# Patient Record
Sex: Male | Born: 1990 | Race: White | Hispanic: No | Marital: Married | Smoking: Never smoker
Health system: Southern US, Community
[De-identification: ages and names within clinical notes are randomized; demographics above are authoritative.]

## PROBLEM LIST (undated history)

## (undated) HISTORY — PX: KNEE SURGERY: SHX244

---

## 2015-08-09 ENCOUNTER — Ambulatory Visit: Payer: Self-pay | Admitting: Physician Assistant

## 2015-08-09 DIAGNOSIS — Z299 Encounter for prophylactic measures, unspecified: Secondary | ICD-10-CM

## 2015-08-09 MED ORDER — TETANUS-DIPHTH-ACELL PERTUSSIS 5-2.5-18.5 LF-MCG/0.5 IM SUSP
0.5000 mL | Freq: Once | INTRAMUSCULAR | Status: AC
  Administered 2015-08-09: 0.5 mL via INTRAMUSCULAR

## 2015-08-09 NOTE — Progress Notes (Signed)
Patient ID: Clayton FarmerZachary D Carton, male   DOB: 12/08/1990, 25 y.o.   MRN: [id number] Patient came in to have a Tdap vaccine.  Vaccine was given and patient tolerated it well.

## 2019-05-09 ENCOUNTER — Ambulatory Visit (INDEPENDENT_AMBULATORY_CARE_PROVIDER_SITE_OTHER): Payer: Worker's Compensation

## 2019-05-09 ENCOUNTER — Other Ambulatory Visit: Payer: Self-pay

## 2019-05-09 ENCOUNTER — Ambulatory Visit
Admission: EM | Admit: 2019-05-09 | Discharge: 2019-05-09 | Disposition: A | Discharge status: Home / Self Care | Payer: Worker's Compensation | Attending: Family Medicine | Admitting: Family Medicine

## 2019-05-09 DIAGNOSIS — M25521 Pain in right elbow: Secondary | ICD-10-CM

## 2019-05-09 DIAGNOSIS — Z042 Encounter for examination and observation following work accident: Secondary | ICD-10-CM | POA: Diagnosis not present

## 2019-05-09 DIAGNOSIS — M7711 Lateral epicondylitis, right elbow: Secondary | ICD-10-CM | POA: Diagnosis not present

## 2019-05-09 MED ORDER — MELOXICAM 15 MG PO TABS: 15.0000 mg | ORAL_TABLET | Freq: Every day | ORAL | 0 refills | Status: DC | PRN

## 2019-05-09 NOTE — Discharge Instructions (Signed)
Take medication as prescribed. Stretch. Ice.   Follow up with occupational health in one week as needed for continued pain.    Return to Urgent care for new or worsening concerns.
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
## 2019-12-27 ENCOUNTER — Other Ambulatory Visit: Payer: Self-pay

## 2019-12-27 DIAGNOSIS — Z20822 Contact with and (suspected) exposure to covid-19: Secondary | ICD-10-CM

## 2019-12-29 LAB — SARS-COV-2, NAA 2 DAY TAT

## 2019-12-29 LAB — NOVEL CORONAVIRUS, NAA: SARS-CoV-2, NAA: NOT DETECTED

## 2021-07-18 IMAGING — CR DG ELBOW COMPLETE 3+V*R*
4 series · 5 of 5 positions shown · non-contrast
Comparison: None.

CLINICAL DATA: Injury while pulling on car door

EXAM:
RIGHT ELBOW - COMPLETE 3+ VIEW

[elbow ap]
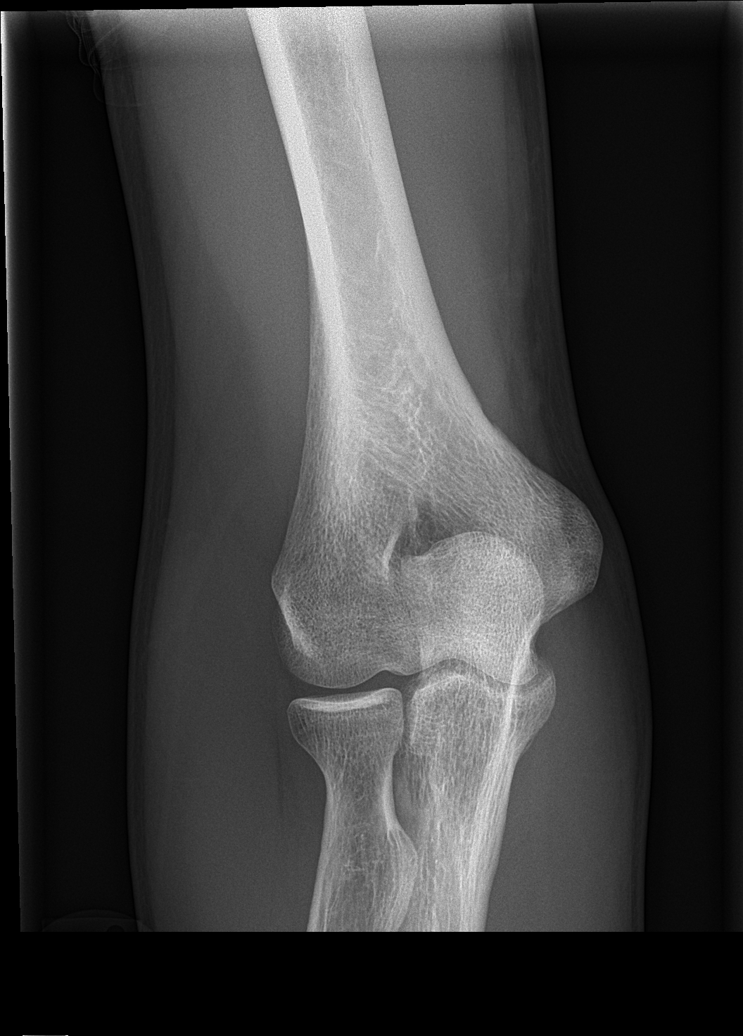

[elbow obl (1 of 2)]
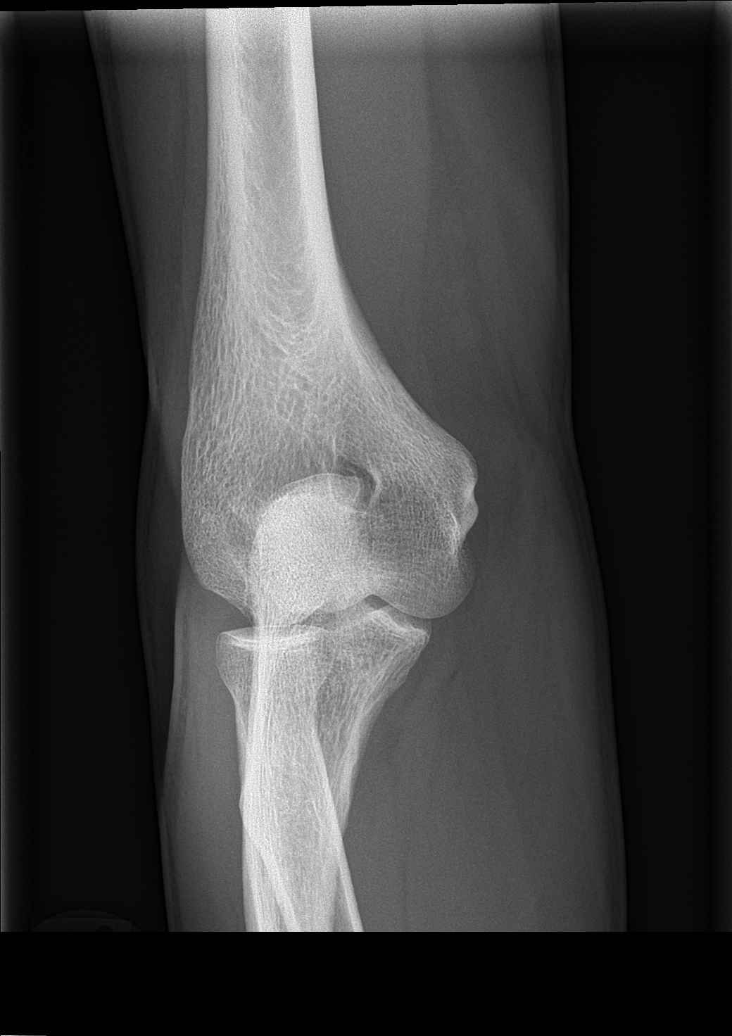

[elbow obl (2 of 2)]
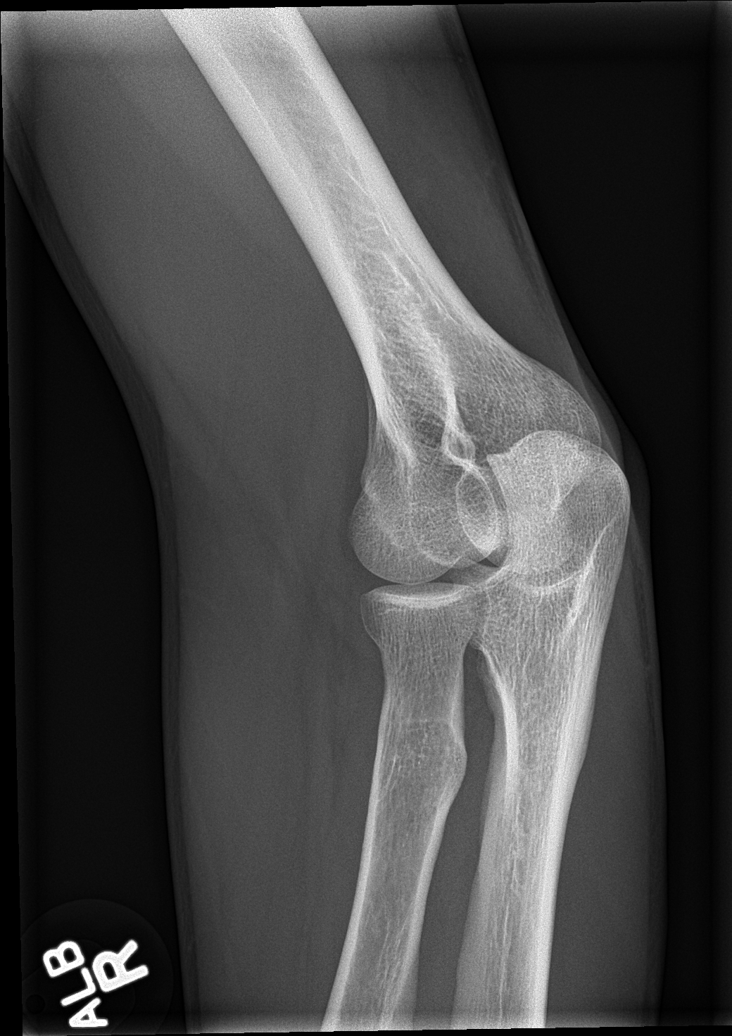

[Series 4: elbow lat · 0.14mm/px · 2 of 2 slices shown]
[im 1/2]
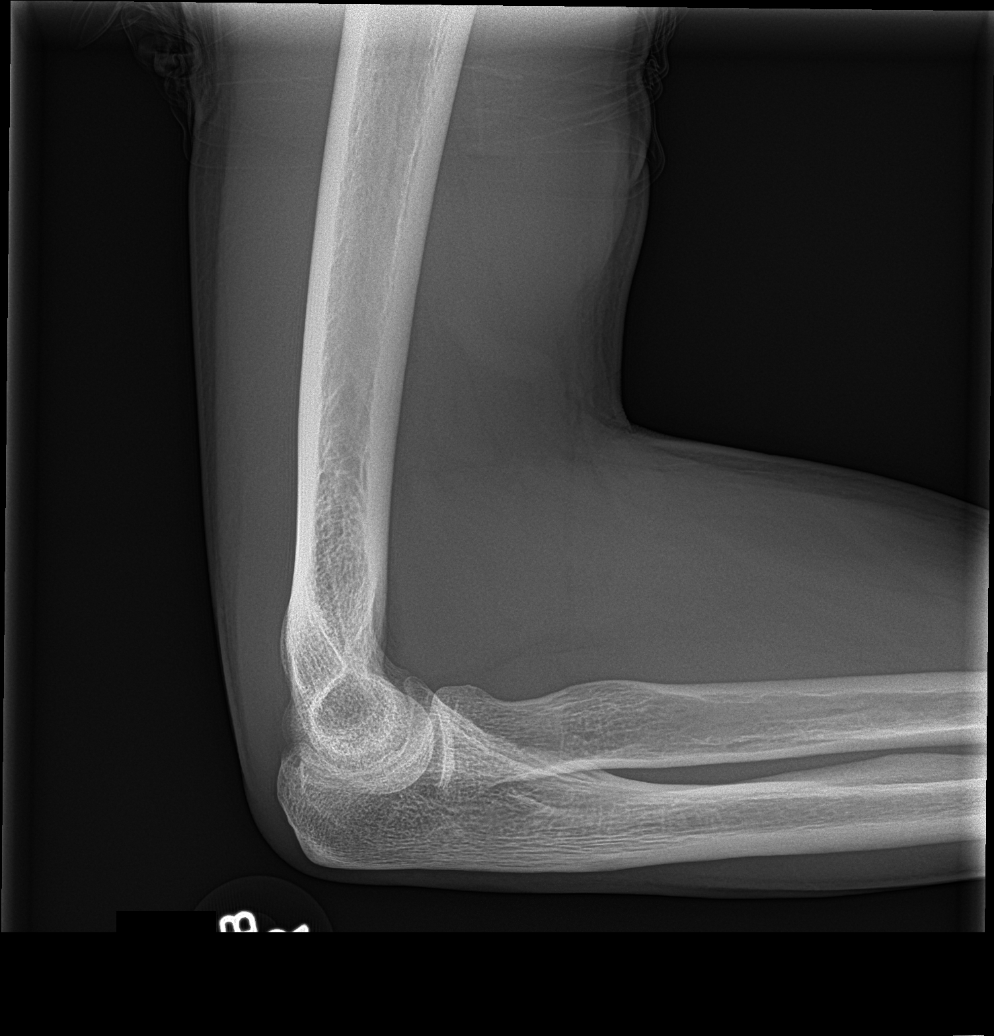
[im 2/2]
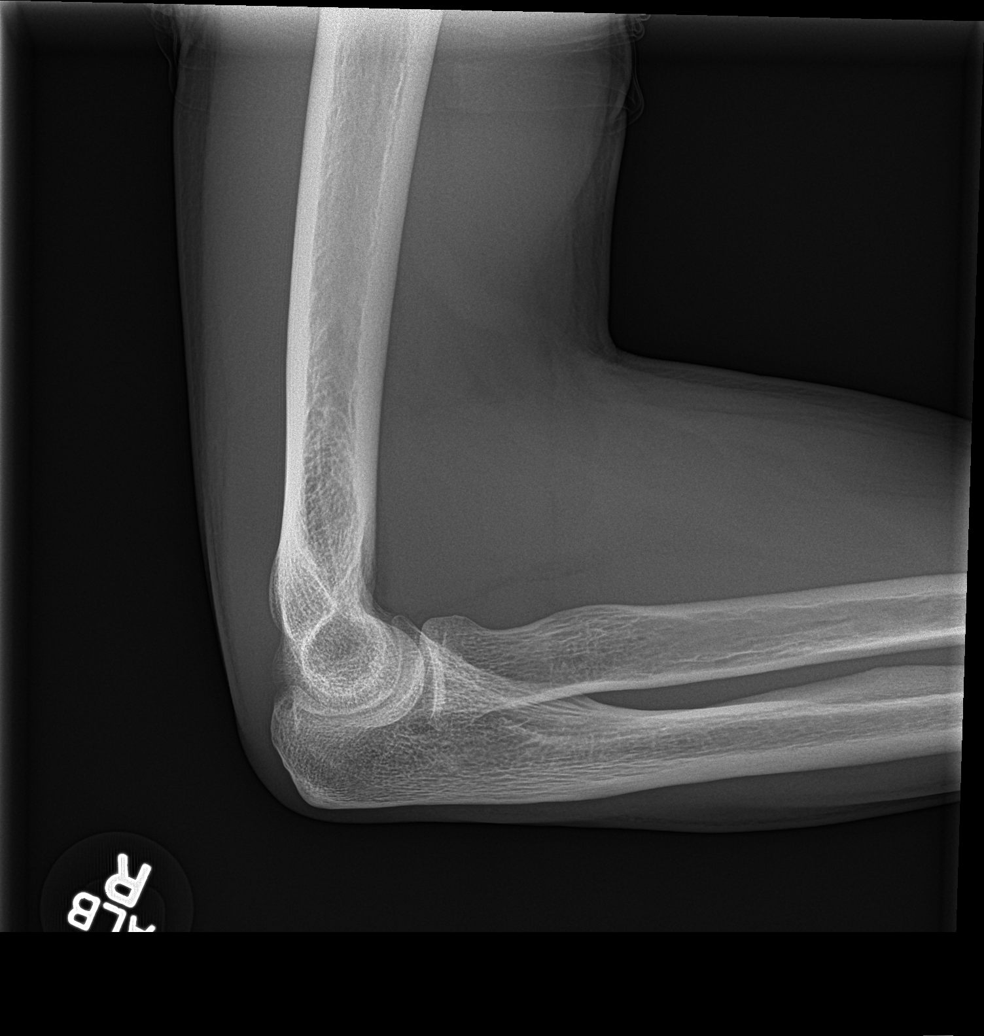

[5 of 5 positions shown; findings below may reference images not displayed]

FINDINGS: Frontal, lateral, and bilateral oblique views were obtained. No
fracture or dislocation. No joint effusion. Joint spaces appear
normal. No erosive change.
IMPRESSION: No fracture or dislocation.  No evident arthropathy.

## 2024-02-03 ENCOUNTER — Emergency Department (HOSPITAL_BASED_OUTPATIENT_CLINIC_OR_DEPARTMENT_OTHER)
Admission: EM | Admit: 2024-02-03 | Discharge: 2024-02-03 | Disposition: A | Discharge status: Home / Self Care | Attending: Emergency Medicine | Admitting: Emergency Medicine

## 2024-02-03 ENCOUNTER — Other Ambulatory Visit: Payer: Self-pay

## 2024-02-03 ENCOUNTER — Emergency Department (HOSPITAL_BASED_OUTPATIENT_CLINIC_OR_DEPARTMENT_OTHER): Admitting: Radiology

## 2024-02-03 ENCOUNTER — Encounter (HOSPITAL_BASED_OUTPATIENT_CLINIC_OR_DEPARTMENT_OTHER): Payer: Self-pay | Admitting: Emergency Medicine

## 2024-02-03 DIAGNOSIS — R0602 Shortness of breath: Secondary | ICD-10-CM | POA: Insufficient documentation

## 2024-02-03 DIAGNOSIS — R0781 Pleurodynia: Secondary | ICD-10-CM | POA: Diagnosis not present

## 2024-02-03 DIAGNOSIS — R0789 Other chest pain: Secondary | ICD-10-CM | POA: Diagnosis present

## 2024-02-03 DIAGNOSIS — R079 Chest pain, unspecified: Secondary | ICD-10-CM

## 2024-02-03 LAB — BASIC METABOLIC PANEL WITH GFR
Anion gap: 8 (ref 5–15)
BUN: 11 mg/dL (ref 6–20)
CO2: 27 mmol/L (ref 22–32)
Calcium: 9.5 mg/dL (ref 8.9–10.3)
Chloride: 102 mmol/L (ref 98–111)
Creatinine, Ser: 1.01 mg/dL (ref 0.61–1.24)
GFR, Estimated: >60 mL/min (ref >60)
Glucose, Bld: 108 mg/dL — ABNORMAL HIGH (ref 70–99)
Potassium: 3.8 mmol/L (ref 3.5–5.1)
Sodium: 138 mmol/L (ref 135–145)

## 2024-02-03 LAB — CBC WITH DIFFERENTIAL/PLATELET
Abs Immature Granulocytes: 0.02 K/uL (ref 0.00–0.07)
Basophils Absolute: 0.1 K/uL (ref 0.0–0.1)
Basophils Relative: 1 %
Eosinophils Absolute: 0.1 K/uL (ref 0.0–0.5)
Eosinophils Relative: 1 %
HCT: 40.6 % (ref 39.0–52.0)
Hemoglobin: 14.1 g/dL (ref 13.0–17.0)
Immature Granulocytes: 0 %
Lymphocytes Relative: 23 %
Lymphs Abs: 1.9 K/uL (ref 0.7–4.0)
MCH: 29.4 pg (ref 26.0–34.0)
MCHC: 34.7 g/dL (ref 30.0–36.0)
MCV: 84.8 fL (ref 80.0–100.0)
Monocytes Absolute: 1 K/uL (ref 0.1–1.0)
Monocytes Relative: 11 %
Neutro Abs: 5.4 K/uL (ref 1.7–7.7)
Neutrophils Relative %: 64 %
Platelets: 260 K/uL (ref 150–400)
RBC: 4.79 MIL/uL (ref 4.22–5.81)
RDW: 11.9 % (ref 11.5–15.5)
WBC: 8.4 K/uL (ref 4.0–10.5)
nRBC: 0 % (ref 0.0–0.2)

## 2024-02-03 LAB — D-DIMER, QUANTITATIVE: D-Dimer, Quant: <0.27 ug{FEU}/mL (ref 0.00–0.50)

## 2024-02-03 LAB — TROPONIN T, HIGH SENSITIVITY: Troponin T High Sensitivity: <15 ng/L (ref 0–19)

## 2024-02-03 LAB — C-REACTIVE PROTEIN: CRP: 0.7 mg/dL (ref <1.0)

## 2024-02-03 LAB — SEDIMENTATION RATE: Sed Rate: 12 mm/h (ref 0–16)

## 2024-02-03 MED ORDER — KETOROLAC TROMETHAMINE 30 MG/ML IJ SOLN
30.0000 mg | Freq: Once | INTRAMUSCULAR | Status: AC
  Administered 2024-02-03: 30 mg via INTRAVENOUS
  Filled 2024-02-03: qty 1

## 2024-02-03 MED ORDER — PREDNISONE 20 MG PO TABS: ORAL_TABLET | ORAL | 0 refills | Status: AC

## 2024-02-03 MED ORDER — DEXAMETHASONE SOD PHOSPHATE PF 10 MG/ML IJ SOLN
10.0000 mg | Freq: Once | INTRAMUSCULAR | Status: AC
  Administered 2024-02-03: 10 mg via INTRAVENOUS

## 2024-02-03 NOTE — ED Triage Notes (Signed)
 Pt c/o chest pain with shob worsening when lying down since yesterday morning. Hx of mitral valve prolapse.

## 2024-02-03 NOTE — ED Provider Notes (Signed)
 [place] EMERGENCY DEPARTMENT AT [facility] Provider Note   CSN: [id number] Arrival date & time: 02/03/24  0230     Patient presents with: Chest Pain   Clayton Sharp is a 33 y.o. male.   Presents to the emergency department for evaluation of chest pain and shortness of breath.  Patient reports symptoms began yesterday morning.  He has had persistent discomfort and heaviness in his chest.  He has had worsening of his symptoms, now experiencing pain when he takes a deep breath and also when he lies flat.  He does not have any cough, no fever.       Prior to Admission medications   Medication Sig Start Date End Date Taking? Authorizing Provider  predniSONE (DELTASONE) 20 MG tablet 3 tabs po daily x 3 days, then 2 tabs x 3 days, then 1.5 tabs x 3 days, then 1 tab x 3 days, then 0.5 tabs x 3 days 02/03/24  Yes Jayro Mcmath, Lonni PARAS, MD    Allergies: Penicillin g    Review of Systems  Updated Vital Signs BP 131/75   Pulse 87   Temp 98.1 F (36.7 C)   Resp 17   Ht 6' 1 (1.854 m)   Wt 77.1 kg   SpO2 99%   BMI 22.43 kg/m   Physical Exam Vitals and nursing note reviewed.  Constitutional:      General: He is not in acute distress.    Appearance: He is well-developed.  HENT:     Head: Normocephalic and atraumatic.     Mouth/Throat:     Mouth: Mucous membranes are moist.  Eyes:     General: Vision grossly intact. Gaze aligned appropriately.     Extraocular Movements: Extraocular movements intact.     Conjunctiva/sclera: Conjunctivae normal.  Cardiovascular:     Rate and Rhythm: Normal rate and regular rhythm.     Pulses: Normal pulses.     Heart sounds: Normal heart sounds, S1 normal and S2 normal. No murmur heard.    No friction rub. No gallop.  Pulmonary:     Effort: Pulmonary effort is normal. No respiratory distress.     Breath sounds: Normal breath sounds.  Abdominal:     Palpations: Abdomen is soft.     Tenderness: There is no abdominal  tenderness. There is no guarding or rebound.     Hernia: No hernia is present.  Musculoskeletal:        General: No swelling.     Cervical back: Full passive range of motion without pain, normal range of motion and neck supple. No pain with movement, spinous process tenderness or muscular tenderness. Normal range of motion.     Right lower leg: No edema.     Left lower leg: No edema.  Skin:    General: Skin is warm and dry.     Capillary Refill: Capillary refill takes less than 2 seconds.     Findings: No ecchymosis, erythema, lesion or wound.  Neurological:     Mental Status: He is alert and oriented to person, place, and time.     GCS: GCS eye subscore is 4. GCS verbal subscore is 5. GCS motor subscore is 6.     Cranial Nerves: Cranial nerves 2-12 are intact.     Sensory: Sensation is intact.     Motor: Motor function is intact. No weakness or abnormal muscle tone.     Coordination: Coordination is intact.  Psychiatric:  Mood and Affect: Mood normal.        Speech: Speech normal.        Behavior: Behavior normal.     (all labs ordered are listed, but only abnormal results are displayed) Labs Reviewed  BASIC METABOLIC PANEL WITH GFR - Abnormal; Notable for the following components:      Result Value   Glucose, Bld 108 (*)    All other components within normal limits  CBC WITH DIFFERENTIAL/PLATELET  D-DIMER, QUANTITATIVE  SEDIMENTATION RATE  C-REACTIVE PROTEIN  TROPONIN T, HIGH SENSITIVITY  TROPONIN T, HIGH SENSITIVITY    EKG: EKG Interpretation Date/Time:  Wednesday February 03 2024 02:36:01 EDT Ventricular Rate:  88 PR Interval:  139 QRS Duration:  91 QT Interval:  354 QTC Calculation: 429 R Axis:   74  Text Interpretation: Sinus rhythm Normal ECG Confirmed by Haze Lonni PARAS ([id number]) on 02/03/2024 2:39:25 AM  Radiology: DG Chest 2 View Result Date: 02/03/2024 EXAM: 2 VIEW(S) XRAY OF THE CHEST 02/03/2024 04:01:55 AM COMPARISON: None available.  CLINICAL HISTORY: chest discomfort, shortness of breath. Table formatting from the original note was not included.; Pt c/o chest pain with shob worsening when lying down since yesterday morning. Hx of mitral valve prolapse. FINDINGS: LUNGS AND PLEURA: Lung markings appear within normal limits. No consolidation. No pulmonary edema. No pleural effusion. No pneumothorax. HEART AND MEDIASTINUM: No acute abnormality of the cardiac and mediastinal silhouettes. BONES AND SOFT TISSUES: Mild extra convex thoracic scoliosis. No acute osseous abnormality. IMPRESSION: 1. No acute cardiopulmonary process. 2. Mild thoracic scoliosis. Electronically signed by: Helayne Hurst MD 02/03/2024 04:20 AM EDT RP Workstation: [id number]     Procedures   Medications Ordered in the ED  dexamethasone (DECADRON) injection 10 mg (has no administration in time range)  ketorolac (TORADOL) 30 MG/ML injection 30 mg (has no administration in time range)                                    Medical Decision Making Amount and/or Complexity of Data Reviewed External Data Reviewed: ECG. Labs: ordered. Decision-making details documented in ED Course. Radiology: ordered and independent interpretation performed. Decision-making details documented in ED Course. ECG/medicine tests: ordered and independent interpretation performed. Decision-making details documented in ED Course.  Risk Prescription drug management.   Differential Diagnosis considered includes, but not limited to: STEMI; NSTEMI; myocarditis; pericarditis; pulmonary embolism; aortic dissection; pneumothorax; pneumonia; gastritis; musculoskeletal pain  Patient with pleuritic chest pain since yesterday.  No significant cardiac risk factors.  EKG is normal.  Symptoms present for more than a day, troponin not detectable.  Doubt acute coronary syndrome.  Additional troponins likely not beneficial.  Patient does have changes in the pain with positional changes.   Musculoskeletal pain and pericarditis are considered.  No friction rub present.  EKG without changes that would suggest pericarditis.  Awaiting sed rate and CRP.  Patient without any specific PE risk factors.  No tachycardia, tachypnea.  He is essentially PERC negative.  Wells criteria very low risk and his D-dimer is undetectable, no further workup necessary to rule out PE.  Will treat with steroids for possible pleurisy.  Cardiology follow-up to further evaluate for subtle pericarditis.  Given return precautions.     Final diagnoses:  Pleuritic chest pain  Chest pain, unspecified type    ED Discharge Orders          Ordered    Ambulatory referral to  Cardiology       Comments: If you have not heard from the Cardiology office within the next 72 hours please call [phone].   02/03/24 0444    predniSONE (DELTASONE) 20 MG tablet        02/03/24 0446               Haze Lonni PARAS, MD 02/03/24 [phone]

## 2024-02-03 NOTE — ED Notes (Signed)
 ED Provider at bedside.

## 2024-03-30 ENCOUNTER — Encounter: Payer: Self-pay | Admitting: Cardiology

## 2024-03-30 ENCOUNTER — Ambulatory Visit: Attending: Cardiology | Admitting: Cardiology

## 2024-03-30 VITALS — BP 110/68 | HR 83 | Ht 73.0 in | Wt 164.0 lb

## 2024-03-30 DIAGNOSIS — R072 Precordial pain: Secondary | ICD-10-CM

## 2024-03-30 NOTE — Progress Notes (Signed)
°  Cardiology Office Note:  .   Date:  03/30/2024  ID:  Clayton Sharp, DOB 04-04-1991, MRN [id number] PCP: Patient, No Pcp Per  [facility] Providers Cardiologist:  Newman Lawrence, MD PCP: Patient, No Pcp Per  Chief Complaint  Patient presents with   Precordial pain     Clayton Sharp is a 33 y.o. male with chest pain  Discussed the use of AI scribe software for clinical note transcription with the patient, who gave verbal consent to proceed.  History of Present Illness   Patient was seen in emergency room in 01/2024 with complaints of chest pain.  Workup was negative for ACS or PE.  He was recommended steroids for possible pleurisy and recommended follow-up with cardiology to evaluate for possible pericarditis.  Patient describes episode of constriction, worse with breathing, and laying supine, improved with sitting up.  He has not had any recurrence of pain since then.  He is back to working his physically demanding job as [employer] with no recurrence of symptoms.  He was prescribed 9 days of steroid with improvement in symptoms, but    Vitals:   03/30/24 0922  BP: 110/68  Pulse: 83  SpO2: 98%      Review of Systems  Cardiovascular:  Negative for chest pain, dyspnea on exertion, leg swelling, palpitations and syncope.        Studies Reviewed: SABRA        EKG 03/30/2024: Normal sinus rhythm Normal ECG When compared with ECG of 03-Feb-2024 02:36, No significant change was found    EKG 02/03/2024: Sinus rhythm 88 bpm Normal EKG  Labs 01/2024: Hb 14.1 Cr 1.01 Trop HS <15, d-Dimer <0.27     Physical Exam Vitals and nursing note reviewed.  Constitutional:      General: He is not in acute distress. Neck:     Vascular: No JVD.  Cardiovascular:     Rate and Rhythm: Normal rate and regular rhythm.     Heart sounds: Normal heart sounds. No murmur heard. Pulmonary:     Effort: Pulmonary effort is normal.     Breath sounds: Normal breath  sounds. No wheezing or rales.  Musculoskeletal:     Right lower leg: No edema.     Left lower leg: No edema.      VISIT DIAGNOSES:   ICD-10-CM   1. Precordial pain  R07.2 EKG 12-Lead       Clayton Sharp is a 33 y.o. male with chest pain Assessment & Plan  Chest pain: Pleuritic in nature, possibly pericarditis, but no evidence on EKG.  Symptoms improved with 9 days of steroid with no recurrence.  I will treat this as a one-off episode of either pleurisy or pericarditis.  No further testing needed at this time.  If he has any recurrence, I would then recommend echocardiogram.  Follow-up as needed.   F/u as needed  Signed, Newman JINNY Lawrence, MD

## 2024-03-30 NOTE — Patient Instructions (Signed)
 Medication Instructions:  Your physician recommends that you continue on your current medications as directed. Please refer to the Current Medication list given to you today.  *If you need a refill on your cardiac medications before your next appointment, please call your pharmacy*  Lab Work: None.  If you have labs (blood work) drawn today and your tests are completely normal, you will receive your results only by: MyChart Message (if you have MyChart) OR A paper copy in the mail If you have any lab test that is abnormal or we need to change your treatment, we will call you to review the results.  Testing/Procedures: None.  Follow-Up: At [facility], you and your health needs are our priority.  As part of our continuing mission to provide you with exceptional heart care, our providers are all part of one team.  This team includes your primary Cardiologist (physician) and Advanced Practice Providers or APPs (Physician Assistants and Nurse Practitioners) who all work together to provide you with the care you need, when you need it.  Your next appointment will be as needed and it will be with:    Provider:   Dr. CHRISTELLA. [place], MD
